# Patient Record
Sex: Male | Born: 2000 | Race: Black or African American | Hispanic: No | Marital: Single | Smoking: Never smoker
Health system: Southern US, Community
[De-identification: ages and names within clinical notes are randomized; demographics above are authoritative.]

## PROBLEM LIST (undated history)

## (undated) DIAGNOSIS — E611 Iron deficiency: Secondary | ICD-10-CM

## (undated) DIAGNOSIS — F909 Attention-deficit hyperactivity disorder, unspecified type: Secondary | ICD-10-CM

---
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
## 2021-01-15 ENCOUNTER — Emergency Department (HOSPITAL_COMMUNITY)
Admission: EM | Admit: 2021-01-15 | Discharge: 2021-01-15 | Disposition: A | Discharge status: Home / Self Care | Payer: Medicaid Other | Attending: Emergency Medicine | Admitting: Emergency Medicine

## 2021-01-15 ENCOUNTER — Other Ambulatory Visit: Payer: Self-pay

## 2021-01-15 ENCOUNTER — Encounter (HOSPITAL_COMMUNITY): Payer: Self-pay

## 2021-01-15 DIAGNOSIS — R112 Nausea with vomiting, unspecified: Secondary | ICD-10-CM | POA: Diagnosis present

## 2021-01-15 DIAGNOSIS — R1084 Generalized abdominal pain: Secondary | ICD-10-CM | POA: Insufficient documentation

## 2021-01-15 LAB — COMPREHENSIVE METABOLIC PANEL
ALT: 17 U/L (ref 0–44)
AST: 25 U/L (ref 15–41)
Albumin: 4.5 g/dL (ref 3.5–5.0)
Alkaline Phosphatase: 46 U/L (ref 38–126)
Anion gap: 11 (ref 5–15)
BUN: 13 mg/dL (ref 6–20)
CO2: 25 mmol/L (ref 22–32)
Calcium: 9.2 mg/dL (ref 8.9–10.3)
Chloride: 108 mmol/L (ref 98–111)
Creatinine, Ser: 0.89 mg/dL (ref 0.61–1.24)
GFR, Estimated: >60 mL/min (ref >60)
Glucose, Bld: 106 mg/dL — ABNORMAL HIGH (ref 70–99)
Potassium: 3.9 mmol/L (ref 3.5–5.1)
Sodium: 144 mmol/L (ref 135–145)
Total Bilirubin: 1 mg/dL (ref 0.3–1.2)
Total Protein: 7.9 g/dL (ref 6.5–8.1)

## 2021-01-15 LAB — CBC
HCT: 42.1 % (ref 39.0–52.0)
Hemoglobin: 13.8 g/dL (ref 13.0–17.0)
MCH: 29.1 pg (ref 26.0–34.0)
MCHC: 32.8 g/dL (ref 30.0–36.0)
MCV: 88.6 fL (ref 80.0–100.0)
Platelets: 212 10*3/uL (ref 150–400)
RBC: 4.75 MIL/uL (ref 4.22–5.81)
RDW: 14 % (ref 11.5–15.5)
WBC: 10.7 10*3/uL — ABNORMAL HIGH (ref 4.0–10.5)
nRBC: 0 % (ref 0.0–0.2)

## 2021-01-15 LAB — URINALYSIS, MICROSCOPIC (REFLEX): Bacteria, UA: NONE SEEN

## 2021-01-15 LAB — URINALYSIS, ROUTINE W REFLEX MICROSCOPIC
Bilirubin Urine: NEGATIVE
Glucose, UA: NEGATIVE mg/dL
Hgb urine dipstick: NEGATIVE
Ketones, ur: NEGATIVE mg/dL
Leukocytes,Ua: NEGATIVE
Nitrite: NEGATIVE
Protein, ur: 30 mg/dL — AB
Specific Gravity, Urine: 1.025 (ref 1.005–1.030)
pH: 6.5 (ref 5.0–8.0)

## 2021-01-15 LAB — GC/CHLAMYDIA PROBE AMP (~~LOC~~) NOT AT ARMC
Chlamydia: NEGATIVE
Comment: NEGATIVE
Comment: NORMAL
Neisseria Gonorrhea: NEGATIVE

## 2021-01-15 LAB — LIPASE, BLOOD: Lipase: 29 U/L (ref 11–51)

## 2021-01-15 MED ORDER — MORPHINE SULFATE (PF) 4 MG/ML IV SOLN
4.0000 mg | Freq: Once | INTRAVENOUS | Status: AC
  Administered 2021-01-15: 4 mg via INTRAVENOUS
  Filled 2021-01-15: qty 1

## 2021-01-15 MED ORDER — ONDANSETRON HCL 4 MG/2ML IJ SOLN
4.0000 mg | Freq: Once | INTRAMUSCULAR | Status: AC
  Administered 2021-01-15: 4 mg via INTRAVENOUS
  Filled 2021-01-15: qty 2

## 2021-01-15 MED ORDER — ONDANSETRON 4 MG PO TBDP: 4.0000 mg | ORAL_TABLET | Freq: Three times a day (TID) | ORAL | 0 refills | Status: AC | PRN

## 2021-01-15 MED ORDER — ONDANSETRON 4 MG PO TBDP: 4.0000 mg | ORAL_TABLET | Freq: Once | ORAL | Status: DC | PRN

## 2021-01-15 MED ORDER — SODIUM CHLORIDE 0.9 % IV BOLUS
1000.0000 mL | Freq: Once | INTRAVENOUS | Status: AC
  Administered 2021-01-15: 1000 mL via INTRAVENOUS

## 2021-01-15 NOTE — ED Provider Notes (Signed)
[facility] Provider Note   CSN: [id number] Arrival date & time: 01/15/21  0210     History Chief Complaint  Patient presents with   Abdominal Pain   Nausea   Emesis    Willie Whitaker is a 20 y.o. male.  Patient presents to the emergency department with a chief complaint of nausea and vomiting.  States that the symptoms started approximately 6 to 8 hours ago.  He reports some lower abdominal pain.  Denies any fevers or chills.  Denies any diarrhea.  States that he has been constipated.  Denies any urinary complaints.  Denies any treatments prior to arrival.  He states that he smokes marijuana, but has never had a reaction like this to the marijuana.  States that he did not smoke today.  The history is provided by the patient. No language interpreter was used.      Past Medical History:  Diagnosis Date   ADHD (attention deficit hyperactivity disorder)    Low iron     There are no problems to display for this patient.   History reviewed. No pertinent surgical history.     History reviewed. No pertinent family history.  Social History   Tobacco Use   Smoking status: Never   Smokeless tobacco: Never  Substance Use Topics   Alcohol use: No   Drug use: No    Home Medications Prior to Admission medications   Not on File    Allergies    Eggs or egg-derived products and Pear  Review of Systems   Review of Systems  All other systems reviewed and are negative.  Physical Exam Updated Vital Signs BP 124/80 (BP Location: Left Arm)   Pulse 77   Temp 98.3 F (36.8 C) (Oral)   Resp 18   Ht 5\' 7"  (1.702 m)   Wt 63.5 kg   SpO2 100%   BMI 21.93 kg/m   Physical Exam Vitals and nursing note reviewed.  Constitutional:      Appearance: He is well-developed.  HENT:     Head: Normocephalic and atraumatic.  Eyes:     Conjunctiva/sclera: Conjunctivae normal.  Cardiovascular:     Rate and Rhythm: Normal rate and regular rhythm.      Heart sounds: No murmur heard. Pulmonary:     Effort: Pulmonary effort is normal. No respiratory distress.     Breath sounds: Normal breath sounds.  Abdominal:     Palpations: Abdomen is soft.     Tenderness: There is no abdominal tenderness.     Comments: Generalized abdominal discomfort without focal tenderness  Musculoskeletal:        General: Normal range of motion.     Cervical back: Neck supple.  Skin:    General: Skin is warm and dry.  Neurological:     Mental Status: He is alert and oriented to person, place, and time.  Psychiatric:        Mood and Affect: Mood normal.        Behavior: Behavior normal.    ED Results / Procedures / Treatments   Labs (all labs ordered are listed, but only abnormal results are displayed) Labs Reviewed  LIPASE, BLOOD  COMPREHENSIVE METABOLIC PANEL  CBC  URINALYSIS, ROUTINE W REFLEX MICROSCOPIC    EKG None  Radiology No results found.  Procedures Procedures   Medications Ordered in ED Medications  ondansetron (ZOFRAN) injection 4 mg (has no administration in time range)  morphine 4 MG/ML injection 4 mg (has  no administration in time range)  sodium chloride 0.9 % bolus 1,000 mL (has no administration in time range)    ED Course  I have reviewed the triage vital signs and the nursing notes.  Pertinent labs & imaging results that were available during my care of the patient were reviewed by me and considered in my medical decision making (see chart for details).    MDM Rules/Calculators/A&P                           Patient here with nausea and vomiting and some generalized abdominal pain.  He states that symptoms started suddenly at 8 PM last night.  Denies any fevers chills.  Denies dysuria.  Laboratory work-up is fairly reassuring.  Mild nonspecific leukocytosis of 10.7, normal LFTs and lipase.  Normal urinalysis.  Patient given fluids, Zofran, and some morphine.  Feels improved.  On reexamination he still does not have  any focal abdominal tenderness, and states that the majority of his pain is in his left upper abdomen.  I doubt appendicitis or cholecystitis.  Suspect that patient symptoms are likely viral.  Will discharge home with some Zofran.  Return precautions discussed. Final Clinical Impression(s) / ED Diagnoses Final diagnoses:  Nausea and vomiting, intractability of vomiting not specified, unspecified vomiting type    Rx / DC Orders ED Discharge Orders     None        Roxy Horseman, PA-C 01/15/21 Claria Dice, MD 01/15/21 [phone]

## 2021-01-15 NOTE — ED Triage Notes (Signed)
Pt complains of nausea, vomiting, and abdominal pain since 8 pm last night.

## 2021-08-02 ENCOUNTER — Other Ambulatory Visit: Payer: Self-pay

## 2021-08-02 ENCOUNTER — Emergency Department (HOSPITAL_COMMUNITY): Payer: Medicaid Other

## 2021-08-02 ENCOUNTER — Emergency Department (HOSPITAL_COMMUNITY)
Admission: EM | Admit: 2021-08-02 | Discharge: 2021-08-02 | Disposition: A | Discharge status: Home / Self Care | Payer: Medicaid Other | Attending: Emergency Medicine | Admitting: Emergency Medicine

## 2021-08-02 ENCOUNTER — Encounter (HOSPITAL_COMMUNITY): Payer: Self-pay

## 2021-08-02 DIAGNOSIS — S161XXA Strain of muscle, fascia and tendon at neck level, initial encounter: Secondary | ICD-10-CM

## 2021-08-02 DIAGNOSIS — S199XXA Unspecified injury of neck, initial encounter: Secondary | ICD-10-CM | POA: Diagnosis present

## 2021-08-02 DIAGNOSIS — Y92410 Unspecified street and highway as the place of occurrence of the external cause: Secondary | ICD-10-CM | POA: Insufficient documentation

## 2021-08-02 DIAGNOSIS — S8992XA Unspecified injury of left lower leg, initial encounter: Secondary | ICD-10-CM | POA: Diagnosis not present

## 2021-08-02 MED ORDER — ACETAMINOPHEN 500 MG PO TABS
1000.0000 mg | ORAL_TABLET | Freq: Once | ORAL | Status: AC
  Administered 2021-08-02: 1000 mg via ORAL
  Filled 2021-08-02: qty 2

## 2021-08-02 MED ORDER — METHOCARBAMOL 500 MG PO TABS: 500.0000 mg | ORAL_TABLET | Freq: Two times a day (BID) | ORAL | 0 refills | Status: AC | PRN

## 2021-08-02 MED ORDER — METHOCARBAMOL 500 MG PO TABS
500.0000 mg | ORAL_TABLET | Freq: Once | ORAL | Status: AC
  Administered 2021-08-02: 500 mg via ORAL
  Filled 2021-08-02: qty 1

## 2021-08-02 MED ORDER — IBUPROFEN 800 MG PO TABS: 800.0000 mg | ORAL_TABLET | Freq: Three times a day (TID) | ORAL | 0 refills | Status: AC | PRN

## 2021-08-02 MED ORDER — IBUPROFEN 800 MG PO TABS
800.0000 mg | ORAL_TABLET | Freq: Once | ORAL | Status: AC
  Administered 2021-08-02: 800 mg via ORAL
  Filled 2021-08-02: qty 1
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
## 2021-08-02 NOTE — Discharge Instructions (Signed)
Try to keep your leg elevated at home, you can take ibuprofen as needed.  Use the crutches as needed for the next 2 to 3 days.  At the end of week you should be able to walk and put weight on your left leg again.  If you are still not able to do that, make an appointment with an orthopedic doctor at the number above. ? ?We talked about reasons to return to the ER.  Specifically if you notice that your left foot is changing colors (skin is turning purple, blue, etc), you are losing all feeling in that foot, or your pain is simply severely worsening in your left lower leg, you should come back to the ER.  These may be signs of an injury that is more serious and needs immediate attention. ?
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
## 2024-05-24 ENCOUNTER — Encounter: Payer: Self-pay | Admitting: Physical Medicine and Rehabilitation

## 2024-05-24 ENCOUNTER — Ambulatory Visit: Admitting: Physical Medicine and Rehabilitation

## 2024-05-24 ENCOUNTER — Other Ambulatory Visit (INDEPENDENT_AMBULATORY_CARE_PROVIDER_SITE_OTHER): Payer: Self-pay

## 2024-05-24 DIAGNOSIS — M5416 Radiculopathy, lumbar region: Secondary | ICD-10-CM | POA: Diagnosis not present

## 2024-05-24 DIAGNOSIS — M5441 Lumbago with sciatica, right side: Secondary | ICD-10-CM

## 2024-05-24 DIAGNOSIS — G8929 Other chronic pain: Secondary | ICD-10-CM

## 2024-05-24 DIAGNOSIS — M5442 Lumbago with sciatica, left side: Secondary | ICD-10-CM | POA: Diagnosis not present

## 2024-05-24 DIAGNOSIS — M7918 Myalgia, other site: Secondary | ICD-10-CM

## 2024-05-24 NOTE — Progress Notes (Signed)
 Pain Scale   Average Pain 4 Patient advising he has chronic lower back pain radiating to both legs pain is constant        +Driver, -BT, -Dye Allergies.
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
## 2024-05-25 ENCOUNTER — Encounter: Payer: Self-pay | Admitting: Physical Medicine and Rehabilitation

## 2024-05-30 ENCOUNTER — Other Ambulatory Visit: Payer: Self-pay

## 2024-05-30 ENCOUNTER — Encounter (HOSPITAL_COMMUNITY): Payer: Self-pay

## 2024-05-30 ENCOUNTER — Ambulatory Visit: Attending: Physical Medicine and Rehabilitation

## 2024-05-30 ENCOUNTER — Emergency Department (HOSPITAL_COMMUNITY)
Admission: EM | Admit: 2024-05-30 | Discharge: 2024-05-30 | Disposition: A | Discharge status: Home / Self Care | Attending: Emergency Medicine | Admitting: Emergency Medicine

## 2024-05-30 DIAGNOSIS — M5416 Radiculopathy, lumbar region: Secondary | ICD-10-CM | POA: Insufficient documentation

## 2024-05-30 DIAGNOSIS — M545 Low back pain, unspecified: Secondary | ICD-10-CM | POA: Insufficient documentation

## 2024-05-30 DIAGNOSIS — K5900 Constipation, unspecified: Secondary | ICD-10-CM | POA: Insufficient documentation

## 2024-05-30 DIAGNOSIS — M7918 Myalgia, other site: Secondary | ICD-10-CM | POA: Diagnosis not present

## 2024-05-30 DIAGNOSIS — G8929 Other chronic pain: Secondary | ICD-10-CM | POA: Insufficient documentation

## 2024-05-30 DIAGNOSIS — Z5329 Procedure and treatment not carried out because of patient's decision for other reasons: Secondary | ICD-10-CM | POA: Insufficient documentation

## 2024-05-30 DIAGNOSIS — M5442 Lumbago with sciatica, left side: Secondary | ICD-10-CM | POA: Insufficient documentation

## 2024-05-30 DIAGNOSIS — M5441 Lumbago with sciatica, right side: Secondary | ICD-10-CM | POA: Diagnosis present

## 2024-05-30 LAB — CBC WITH DIFFERENTIAL/PLATELET
Abs Immature Granulocytes: 0.01 K/uL (ref 0.00–0.07)
Basophils Absolute: 0 K/uL (ref 0.0–0.1)
Basophils Relative: 1 %
Eosinophils Absolute: 0.1 K/uL (ref 0.0–0.5)
Eosinophils Relative: 1 %
HCT: 41.3 % (ref 39.0–52.0)
Hemoglobin: 13.9 g/dL (ref 13.0–17.0)
Immature Granulocytes: 0 %
Lymphocytes Relative: 38 %
Lymphs Abs: 2.5 K/uL (ref 0.7–4.0)
MCH: 29.2 pg (ref 26.0–34.0)
MCHC: 33.7 g/dL (ref 30.0–36.0)
MCV: 86.8 fL (ref 80.0–100.0)
Monocytes Absolute: 0.4 K/uL (ref 0.1–1.0)
Monocytes Relative: 6 %
Neutro Abs: 3.6 K/uL (ref 1.7–7.7)
Neutrophils Relative %: 54 %
Platelets: 214 K/uL (ref 150–400)
RBC: 4.76 MIL/uL (ref 4.22–5.81)
RDW: 13.7 % (ref 11.5–15.5)
WBC: 6.6 K/uL (ref 4.0–10.5)
nRBC: 0 % (ref 0.0–0.2)

## 2024-05-30 LAB — COMPREHENSIVE METABOLIC PANEL WITH GFR
ALT: 13 U/L (ref 0–44)
AST: 18 U/L (ref 15–41)
Albumin: 4.5 g/dL (ref 3.5–5.0)
Alkaline Phosphatase: 59 U/L (ref 38–126)
Anion gap: 9 (ref 5–15)
BUN: 8 mg/dL (ref 6–20)
CO2: 26 mmol/L (ref 22–32)
Calcium: 9.1 mg/dL (ref 8.9–10.3)
Chloride: 104 mmol/L (ref 98–111)
Creatinine, Ser: 0.99 mg/dL (ref 0.61–1.24)
GFR, Estimated: >60 mL/min
Glucose, Bld: 92 mg/dL (ref 70–99)
Potassium: 3.9 mmol/L (ref 3.5–5.1)
Sodium: 138 mmol/L (ref 135–145)
Total Bilirubin: 1 mg/dL (ref 0.0–1.2)
Total Protein: 7.4 g/dL (ref 6.5–8.1)

## 2024-05-30 LAB — URINALYSIS, ROUTINE W REFLEX MICROSCOPIC
Bilirubin Urine: NEGATIVE
Glucose, UA: NEGATIVE mg/dL
Hgb urine dipstick: NEGATIVE
Ketones, ur: NEGATIVE mg/dL
Leukocytes,Ua: NEGATIVE
Nitrite: NEGATIVE
Protein, ur: NEGATIVE mg/dL
Specific Gravity, Urine: 1.016 (ref 1.005–1.030)
pH: 8 (ref 5.0–8.0)
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
## 2024-05-30 NOTE — Progress Notes (Signed)
" °   05/30/24 1839  Spiritual Encounters  Type of Visit Initial  Care provided to: [facility] partners present during encounter Nurse  Reason for visit Routine spiritual support  OnCall Visit Yes   Provided assistance to patient's family at their request.  "
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
## 2024-05-30 NOTE — ED Notes (Signed)
 Pt requested IV be taken out and that he's going home because he's in too much pain/ this nurse offered to ask the provider for some pain meds but pt refused

## 2024-06-01 ENCOUNTER — Inpatient Hospital Stay
Admission: RE | Admit: 2024-06-01 | Discharge: 2024-06-01 | Attending: Physical Medicine and Rehabilitation | Admitting: Physical Medicine and Rehabilitation

## 2024-06-01 DIAGNOSIS — G8929 Other chronic pain: Secondary | ICD-10-CM

## 2024-06-01 DIAGNOSIS — M5416 Radiculopathy, lumbar region: Secondary | ICD-10-CM

## 2024-06-01 DIAGNOSIS — M7918 Myalgia, other site: Secondary | ICD-10-CM
